# Patient Record
Sex: Female | Born: 1985 | Race: White | Hispanic: No | Marital: Single | Smoking: Current every day smoker
Health system: Southern US, Community
[De-identification: ages and names within clinical notes are randomized; demographics above are authoritative.]

## PROBLEM LIST (undated history)

## (undated) HISTORY — PX: WISDOM TOOTH EXTRACTION: SHX21

---
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
## 2009-08-10 ENCOUNTER — Inpatient Hospital Stay (HOSPITAL_COMMUNITY): Admission: AD | Admit: 2009-08-10 | Discharge: 2009-08-10 | Payer: Self-pay | Admitting: Obstetrics & Gynecology

## 2010-09-28 LAB — WET PREP, GENITAL
Clue Cells Wet Prep HPF POC: NONE SEEN
Trich, Wet Prep: NONE SEEN
Yeast Wet Prep HPF POC: NONE SEEN

## 2010-09-28 LAB — COMPREHENSIVE METABOLIC PANEL
ALT: 14 U/L (ref 0–35)
AST: 14 U/L (ref 0–37)
Albumin: 3.4 g/dL — ABNORMAL LOW (ref 3.5–5.2)
Alkaline Phosphatase: 42 U/L (ref 39–117)
Chloride: 104 mEq/L (ref 96–112)
Potassium: 3.9 mEq/L (ref 3.5–5.1)
Sodium: 135 mEq/L (ref 135–145)
Total Bilirubin: 0.2 mg/dL — ABNORMAL LOW (ref 0.3–1.2)

## 2010-09-28 LAB — CBC
HCT: 37.1 % (ref 36.0–46.0)
Hemoglobin: 12.8 g/dL (ref 12.0–15.0)
MCHC: 34.5 g/dL (ref 30.0–36.0)
MCV: 97.2 fL (ref 78.0–100.0)
RBC: 3.82 MIL/uL — ABNORMAL LOW (ref 3.87–5.11)
WBC: 8.7 10*3/uL (ref 4.0–10.5)

## 2010-09-28 LAB — GC/CHLAMYDIA PROBE AMP, GENITAL
Chlamydia, DNA Probe: NEGATIVE
GC Probe Amp, Genital: NEGATIVE

## 2010-09-28 LAB — URINALYSIS, ROUTINE W REFLEX MICROSCOPIC
Bilirubin Urine: NEGATIVE
Nitrite: NEGATIVE
Protein, ur: NEGATIVE mg/dL
Specific Gravity, Urine: 1.02 (ref 1.005–1.030)
Urobilinogen, UA: 0.2 mg/dL (ref 0.0–1.0)

## 2020-05-02 ENCOUNTER — Telehealth: Payer: Self-pay

## 2020-05-02 ENCOUNTER — Other Ambulatory Visit: Payer: Self-pay

## 2020-05-02 DIAGNOSIS — R2231 Localized swelling, mass and lump, right upper limb: Secondary | ICD-10-CM

## 2020-05-02 NOTE — Telephone Encounter (Signed)
Patient left message requesting return call, states she has a lump in the right axillary area, outer breast with pain, unexplained weight loss x 6 months. Patient is concerned as her mother was diagnosed with breast cancer 06/2019. Patient is scheduled to be seen on 05/21/2020 @ 8:15am.

## 2020-05-21 ENCOUNTER — Ambulatory Visit: Payer: Self-pay

## 2020-05-21 ENCOUNTER — Other Ambulatory Visit: Payer: Self-pay

## 2020-07-23 ENCOUNTER — Ambulatory Visit: Payer: Medicaid Other | Admitting: *Deleted

## 2020-07-23 ENCOUNTER — Other Ambulatory Visit: Payer: Self-pay

## 2020-07-23 VITALS — BP 124/88 | Wt 154.8 lb

## 2020-07-23 DIAGNOSIS — Z1239 Encounter for other screening for malignant neoplasm of breast: Secondary | ICD-10-CM

## 2020-07-23 DIAGNOSIS — N644 Mastodynia: Secondary | ICD-10-CM

## 2020-07-23 NOTE — Patient Instructions (Signed)
Explained breast self awareness with Sheila Stanley. Pap smear not completed today due to patient has [facility] and not covered by [employer]. Patient stated she will call the [facility] to schedule her Pap smear. Let her know [facility] will cover Pap smears every 3 years unless has a history of abnormal Pap smears. Referred patient to the [facility] for a diagnostic mammogram. Appointment scheduled Tuesday, July 30, 2020 at 1450. Patient aware of appointment and will be there. Discussed smoking cessation with patient. Referred to the [facility] Quitline and gave resources to the free smoking cessation classes at [facility]. Sheila Stanley verbalized understanding.  Sheila Stanley, [name], RN 2:56 PM

## 2020-07-23 NOTE — Progress Notes (Signed)
[name] is a 35 y.o. female who presents to [facility] clinic today with complaint of right axillary lump x one year and bilateral breast pain x 1-2 years. Patient states the pain comes and goes. Patient states the pain is within the right upper breast and axillary area. Patient states the left breast pain is diffuse. Patient rates the right breast pain at a 6-7 out of 10 and 3-4 out of 10 within the left breast.    Pap Smear: Pap smear not completed today. Last Pap smear was in 2018 while incarcerated and was normal per patient. Per patient has history of two abnormal Pap smears in 2007 and 2008 or 2009 that cryotherapy was completed for follow-up. Last Pap smear result is not available in Epic.   Physical exam: Breasts Breasts symmetrical. No skin abnormalities bilateral breasts. No nipple retraction bilateral breasts. No nipple discharge bilateral breasts. No lymphadenopathy. No lumps palpated bilateral breasts. Unable to palpate any lumps on exam within patients area of concern. Complaints of right outer and left diffuse breast pain on exam.      Pelvic/Bimanual Pap not completed due to patient has [facility] and not covered by [employer].   Smoking History: Patient is a current smoker. Discussed smoking cessation with patient. Referred to the [facility] Quitline and gave resources to the free smoking cessation classes at [facility].   Patient Navigation: Patient education provided. Access to services provided for patient through [facility] program.    Breast and Cervical Cancer Risk Assessment: Patient has family history of her mother having breast cancer. Patient has no known genetic mutations or history of radiation treatment to the chest before age 70. Per patient has history of cervical dysplasia. Patient has no history of being immunocompromised or DES exposure in-utero. Breast cancer risk assessment completed. No breast cancer risk calculated due to patient is less than 35-years  old.  Risk Assessment    Risk Scores      07/23/2020   Last edited by: Meryl Dare, CMA   5-year risk:    Lifetime risk:          A: [facility] exam without pap smear Complaint of right axillary lump and bilateral breast pain.  P: Referred patient to the [facility] for a diagnostic mammogram. Appointment scheduled Tuesday, July 30, 2020 at 1450.  Priscille Heidelberg, RN 07/23/2020 2:56 PM

## 2020-07-30 ENCOUNTER — Ambulatory Visit
Admission: RE | Admit: 2020-07-30 | Discharge: 2020-07-30 | Disposition: A | Discharge status: Home / Self Care | Payer: Medicaid Other | Source: Ambulatory Visit | Attending: Obstetrics and Gynecology | Admitting: Obstetrics and Gynecology

## 2020-07-30 ENCOUNTER — Other Ambulatory Visit: Payer: Self-pay

## 2020-07-30 DIAGNOSIS — R2231 Localized swelling, mass and lump, right upper limb: Secondary | ICD-10-CM

## 2021-11-04 IMAGING — MG DIGITAL DIAGNOSTIC BILAT W/ TOMO W/ CAD
8 series · 8 of 24 positions shown · non-contrast
Comparison: None.

CLINICAL DATA: Patient complaining of lumpiness and tenderness in
the upper outer right breast.

EXAM:
DIGITAL DIAGNOSTIC BILATERAL MAMMOGRAM WITH CAD AND TOMOSYNTHESIS
ULTRASOUND BREAST UNILATERAL
TECHNIQUE: Bilateral digital diagnostic mammography and breast tomosynthesis
was performed. Digital images of the breasts were evaluated with
computer-aided detection. Targeted ultrasound examination of the
bilateral breast was performed.

[R MLO synth-2D]
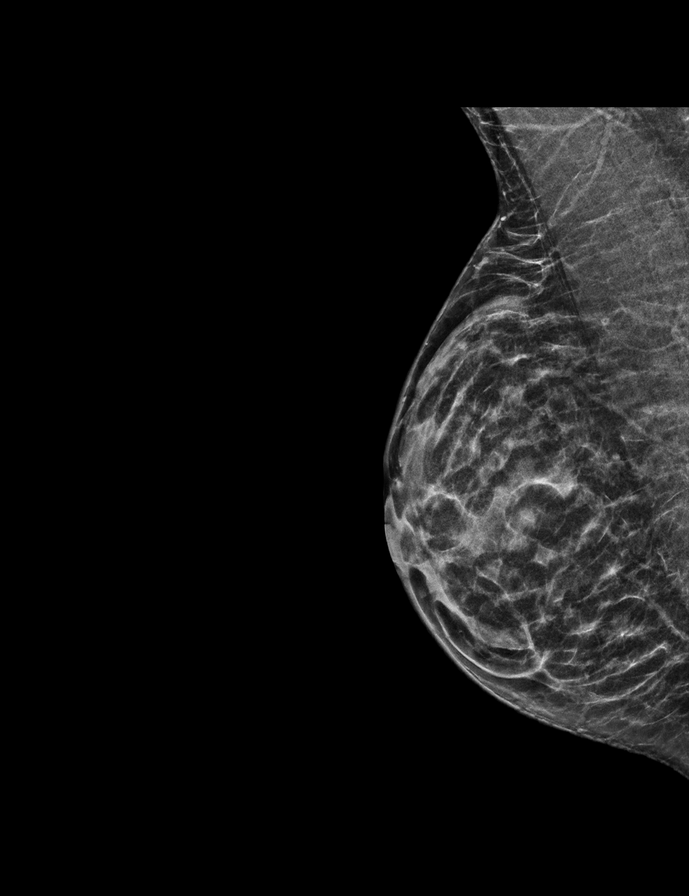

[L MLO synth-2D]
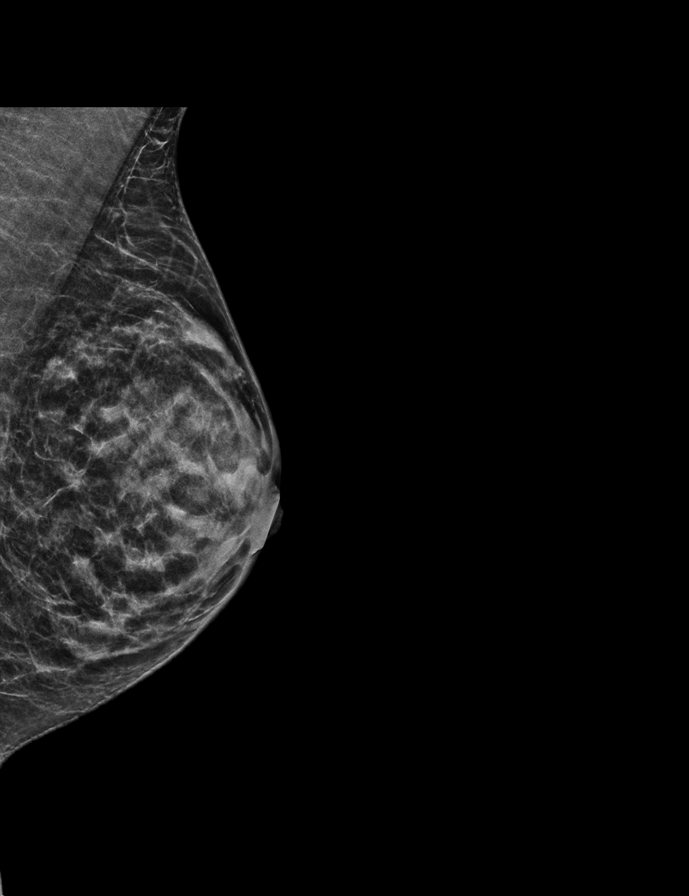

[L CC synth-2D]
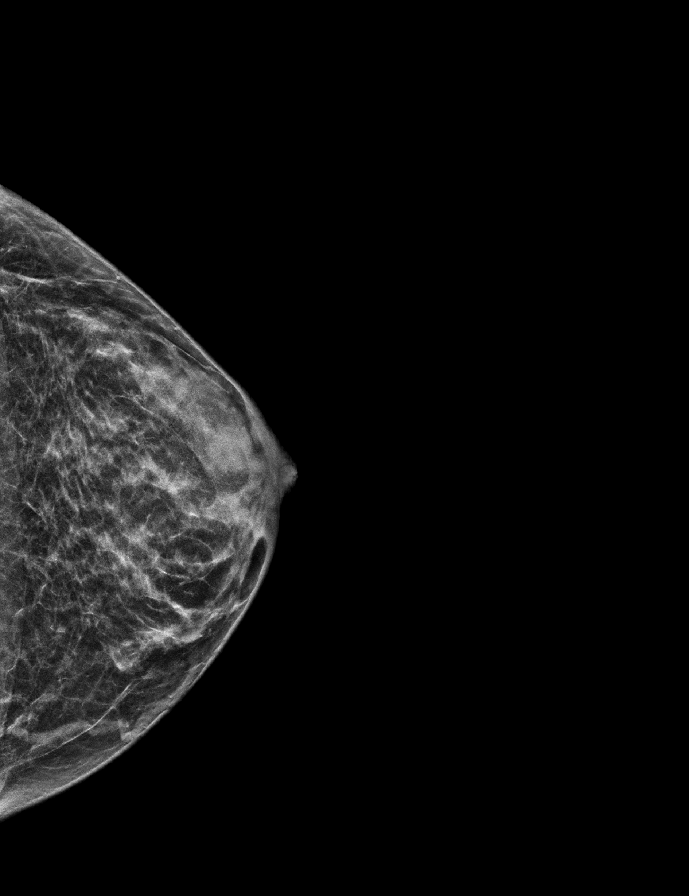

[R CC synth-2D]
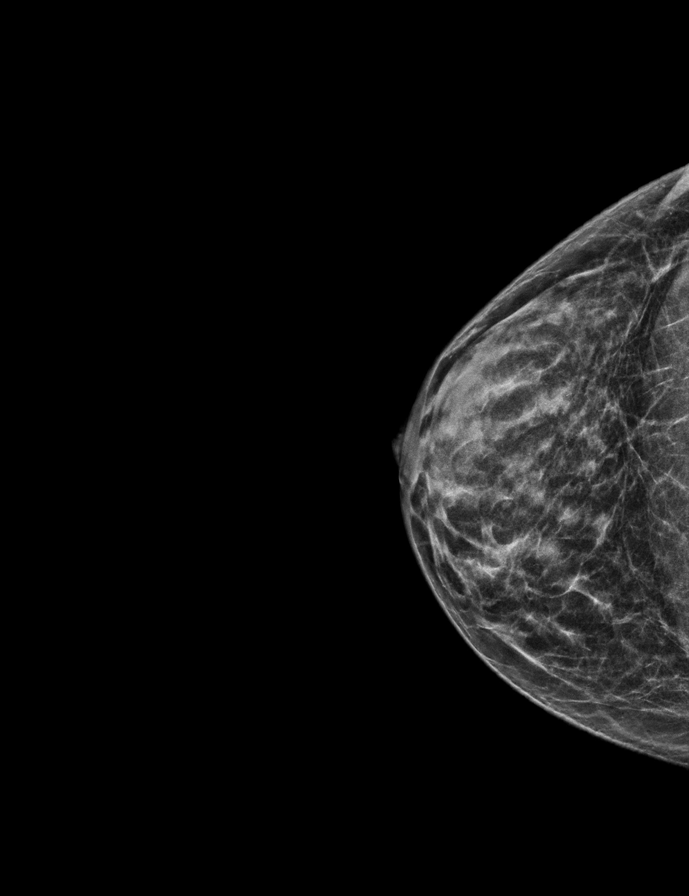

[L CC tomo · tomo slice 21/40.0]
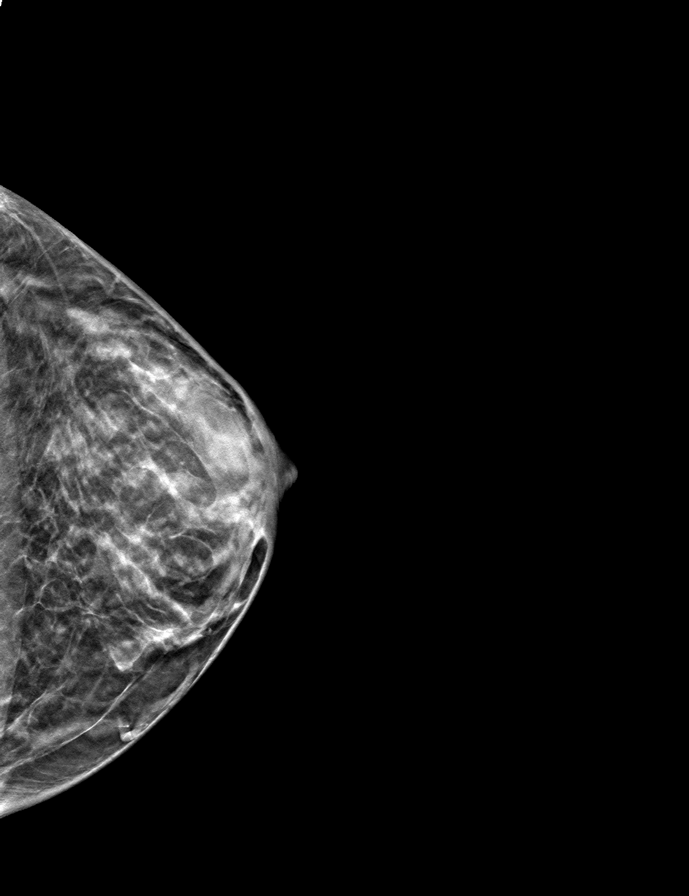

[L MLO tomo · tomo slice 21/40.0]
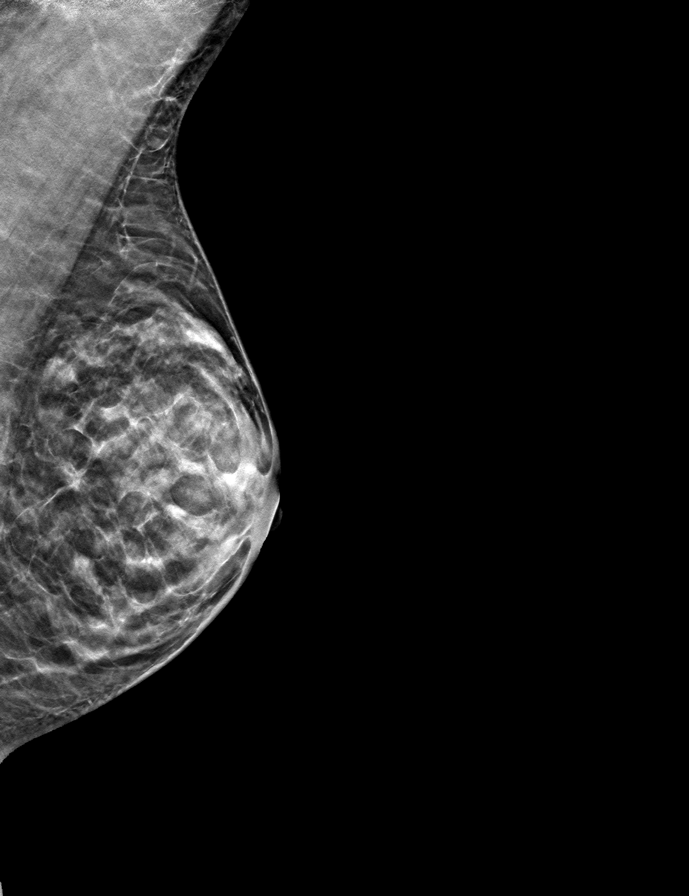

[R MLO tomo · tomo slice 21/40.0]
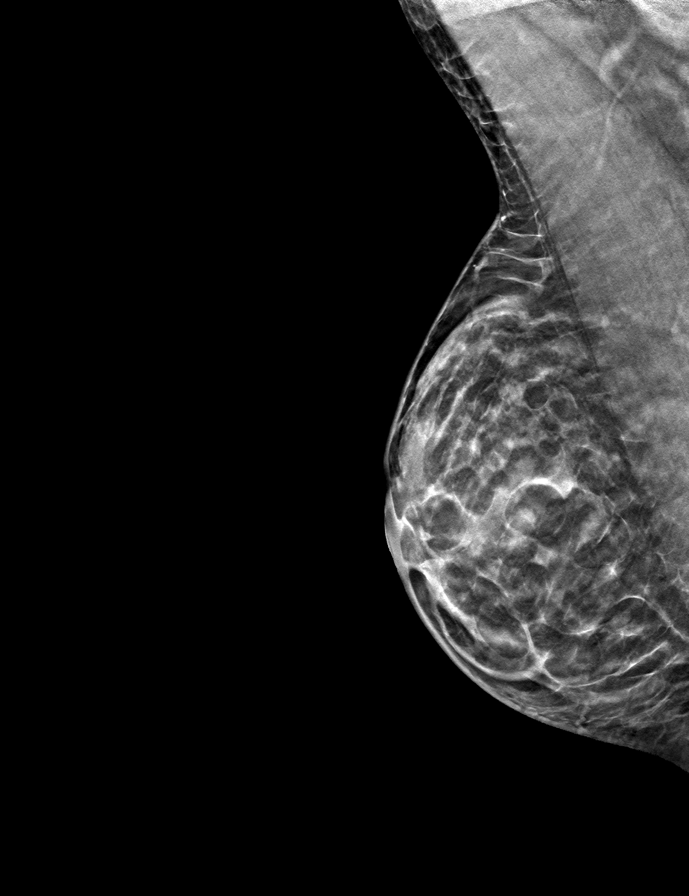

[R CC tomo · tomo slice 21/42.0]
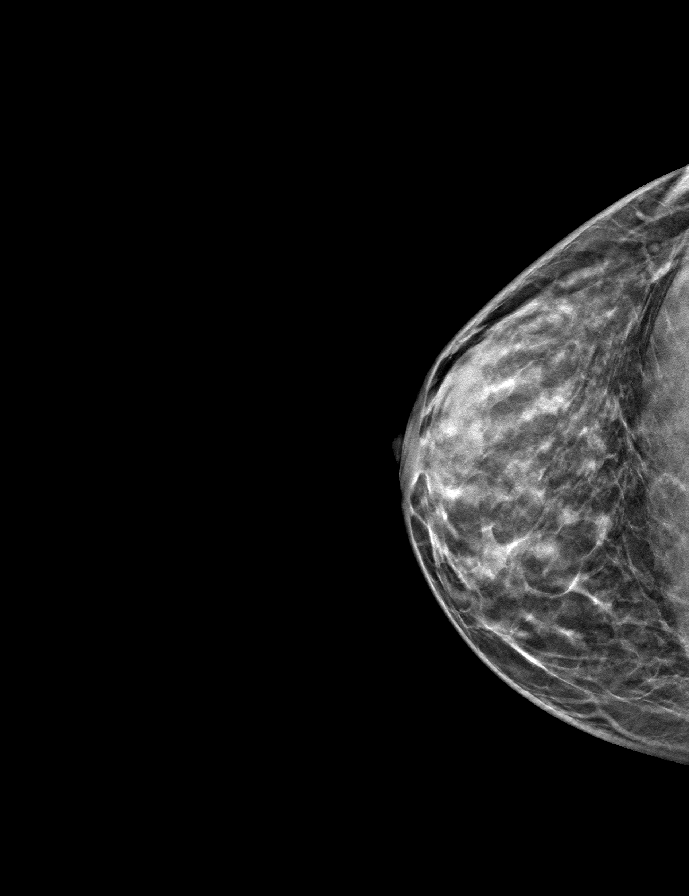

[8 of 24 positions shown; findings below may reference images not displayed]

ACR Breast Density Category c: The breast tissue is heterogeneously
dense, which may obscure small masses.
FINDINGS: There are no masses, areas of architectural distortion, areas of
significant asymmetry or suspicious calcifications.

On physical exam, there is a lumpy texture to the fibroglandular
tissue of the lateral and upper outer right breast. No defined mass.
Patient was tender to palpation.

Targeted right breast ultrasound is performed, showing normal
fibroglandular tissue throughout the lateral and upper outer right
breast. No mass, cyst or suspicious lesion.
IMPRESSION: Normal exam.  No evidence of breast malignancy.

RECOMMENDATION:
Screening mammogram at age 42 unless there are persistent or
intervening clinical concerns. (Code:[id number])

I have discussed the findings and recommendations with the patient.
If applicable, a reminder letter will be sent to the patient
regarding the next appointment.

BI-RADS CATEGORY  1: Negative.
